# Patient Record
Sex: Male | Born: 2000 | Hispanic: Refuse to answer | Marital: Single | Smoking: Never smoker
Health system: Southern US, Community
[De-identification: ages and names within clinical notes are randomized; demographics above are authoritative.]

## PROBLEM LIST (undated history)

## (undated) DIAGNOSIS — J309 Allergic rhinitis, unspecified: Secondary | ICD-10-CM

## (undated) DIAGNOSIS — K219 Gastro-esophageal reflux disease without esophagitis: Secondary | ICD-10-CM

## (undated) HISTORY — PX: TONSILLECTOMY: SUR1361

## (undated) HISTORY — DX: Gastro-esophageal reflux disease without esophagitis: K21.9

## (undated) HISTORY — DX: Allergic rhinitis, unspecified: J30.9

---

## 2018-01-28 ENCOUNTER — Ambulatory Visit (INDEPENDENT_AMBULATORY_CARE_PROVIDER_SITE_OTHER): Payer: PRIVATE HEALTH INSURANCE | Admitting: Family Medicine

## 2018-01-28 ENCOUNTER — Encounter: Payer: Self-pay | Admitting: Family Medicine

## 2018-01-28 VITALS — BP 110/58 | HR 55 | Temp 98.1°F | Resp 14

## 2018-01-28 DIAGNOSIS — R509 Fever, unspecified: Secondary | ICD-10-CM

## 2018-01-28 DIAGNOSIS — J029 Acute pharyngitis, unspecified: Secondary | ICD-10-CM

## 2018-01-28 LAB — CBC WITH DIFFERENTIAL/PLATELET
BASOS ABS: 0 10*3/uL (ref 0.0–0.2)
Basos: 1 %
EOS (ABSOLUTE): 0.2 10*3/uL (ref 0.0–0.4)
Eos: 3 %
HEMATOCRIT: 44.2 % (ref 37.5–51.0)
Hemoglobin: 16 g/dL (ref 13.0–17.7)
Immature Grans (Abs): 0 10*3/uL (ref 0.0–0.1)
Immature Granulocytes: 0 %
Lymphocytes Absolute: 1.6 10*3/uL (ref 0.7–3.1)
Lymphs: 26 %
MCH: 32 pg (ref 26.6–33.0)
MCHC: 36.2 g/dL — ABNORMAL HIGH (ref 31.5–35.7)
MCV: 88 fL (ref 79–97)
MONOS ABS: 0.6 10*3/uL (ref 0.1–0.9)
Monocytes: 10 %
NEUTROS ABS: 3.7 10*3/uL (ref 1.4–7.0)
Neutrophils: 60 %
Platelets: 190 10*3/uL (ref 150–450)
RBC: 5 x10E6/uL (ref 4.14–5.80)
RDW: 13.5 % (ref 12.3–15.4)
WBC: 6.2 10*3/uL (ref 3.4–10.8)

## 2018-01-28 LAB — POCT INFLUENZA A/B
INFLUENZA B, POC: NEGATIVE
Influenza A, POC: NEGATIVE

## 2018-01-28 LAB — POCT RAPID STREP A (OFFICE): Rapid Strep A Screen: NEGATIVE

## 2018-01-28 NOTE — Progress Notes (Signed)
Patient presents today with sore throat and feeling hot and cold for the last day.patient denies measured fever. Denies any nasal congestion or cough. Does admit to mild headache. Sore throat is a 6 out of 10 right now. Denies any sick contacts. Denies any abdominal pain, nausea, vomiting, diarrhea, extreme fatigue. Patient is not taking any medications today. He admits to taking Ibuprofen before bed last night.  ROS: Negative except mentioned above. Vitals as per Epic. GENERAL: NAD HEENT: moderate pharyngeal erythema, no exudate, no erythema of TMs, no cervical LAD RESP: CTA B CARD: RRR ABD: positive bowel sounds, nontender, no rebound or guarding appreciated, no organomegaly appreciated NEURO: CN II-XII grossly intact   A/P: Pharyngitis - rapid strep test negative, flu screen negative, CBC with differential sent to lab, rest, hydration, ibuprofen when necessary, no athletic activity for now until results have been reviewed, seek medical attention if any persistent or worsening symptoms.

## 2018-02-08 ENCOUNTER — Ambulatory Visit (INDEPENDENT_AMBULATORY_CARE_PROVIDER_SITE_OTHER): Payer: PRIVATE HEALTH INSURANCE | Admitting: Family Medicine

## 2018-02-08 VITALS — BP 129/57 | HR 66 | Temp 98.0°F | Resp 14

## 2018-02-08 DIAGNOSIS — J069 Acute upper respiratory infection, unspecified: Secondary | ICD-10-CM

## 2018-02-08 MED ORDER — AZITHROMYCIN 250 MG PO TABS: ORAL_TABLET | ORAL | 0 refills | Status: DC

## 2018-02-08 MED ORDER — BENZONATATE 100 MG PO CAPS: 100.0000 mg | ORAL_CAPSULE | Freq: Two times a day (BID) | ORAL | 0 refills | Status: DC | PRN

## 2018-02-08 NOTE — Progress Notes (Signed)
Patient presents with symptoms of nasal congestion, sinus pressure, mild productive cough. Has had symptoms for the last 4-5 days. Denies any sore throat, CP, SOB, fever, night sweats, severe headache, N/V/D, abdominal pain. Denies hx of asthma but does have chronic allergies and takes Singulair. The mucus has been yellow-green. Has tried Robitussin for cough with minimal relief. Admits that he has been able to practice with no significant difficulty.  ROS: Negative except mentioned above. Vitals as per Epic.  GENERAL: NAD HEENT: mild pharyngeal erythema, no exudate, no erythema of TMs, no cervical LAD RESP: CTA B CARD: RRR NEURO: CN II-XII grossly intact    A/P: URI - will treat with Z-pk given allergy to PCN, continue Singulair, add Flonase for nasal congestion, can try Tessalon Perles if OTC cough suppressant is not working, rest, hydration, no athletic activity if febrile or experiencing SOB as discussed.

## 2018-03-26 ENCOUNTER — Encounter: Payer: Self-pay | Admitting: Family Medicine

## 2018-03-26 ENCOUNTER — Ambulatory Visit (INDEPENDENT_AMBULATORY_CARE_PROVIDER_SITE_OTHER): Payer: PRIVATE HEALTH INSURANCE | Admitting: Family Medicine

## 2018-03-26 VITALS — BP 130/58 | HR 51 | Temp 97.8°F | Resp 14

## 2018-03-26 DIAGNOSIS — J029 Acute pharyngitis, unspecified: Secondary | ICD-10-CM | POA: Diagnosis not present

## 2018-03-26 DIAGNOSIS — J069 Acute upper respiratory infection, unspecified: Secondary | ICD-10-CM | POA: Diagnosis not present

## 2018-03-26 MED ORDER — AZITHROMYCIN 250 MG PO TABS: ORAL_TABLET | ORAL | 0 refills | Status: DC

## 2018-03-26 NOTE — Progress Notes (Signed)
Patient presents today with symptoms of sore throat, cough, nasal congestion.  Patient states that he has had the symptoms for the past week.  He denies any fever, chest pain, shortness of breath.  He denies any history of asthma.  He states the mucus is now green in color.  He has been taking his Allegra and leftover [employer].  He denies any problems with playing football.  ROS: Negative except mentioned above. Vitals as per Epic. GENERAL: NAD HEENT: mild pharyngeal erythema, no exudate, no erythema of TMs, no cervical LAD RESP: CTA B CARD: RRR NEURO: CN II-XII grossly intact   A/P: URI - will treat with Z-Pak, can continue oral antihistamine and cough suppressant, rest, hydration, seek medical attention if symptoms persist or worsen.  No athletic activity or class if febrile.  Athletic activity as tolerated.

## 2018-06-06 ENCOUNTER — Ambulatory Visit (INDEPENDENT_AMBULATORY_CARE_PROVIDER_SITE_OTHER): Payer: PRIVATE HEALTH INSURANCE | Admitting: Family Medicine

## 2018-06-06 VITALS — BP 113/67 | HR 77 | Temp 98.4°F | Resp 14

## 2018-06-06 DIAGNOSIS — R509 Fever, unspecified: Secondary | ICD-10-CM

## 2018-06-06 LAB — POCT INFLUENZA A/B
Influenza A, POC: NEGATIVE
Influenza B, POC: NEGATIVE

## 2018-06-06 LAB — POCT RAPID STREP A (OFFICE): RAPID STREP A SCREEN: NEGATIVE

## 2018-06-06 MED ORDER — OSELTAMIVIR PHOSPHATE 75 MG PO CAPS: 75.0000 mg | ORAL_CAPSULE | Freq: Two times a day (BID) | ORAL | 0 refills | Status: DC

## 2018-06-06 NOTE — Progress Notes (Signed)
Patient presents today with symptoms of cough and fever.  Patient has had symptoms for the last 2 days.  Patient states that he has been around sick contacts with flulike symptoms.  He has been sweating and having chills.  He denies any abdominal pain but did cough and vomit once.  He has drank fluids this morning without problems.  He denies sore throat, chest pain, shortness of breath, abdominal pain, urinary symptoms, severe headache, neck stiffness, photophobia.  Patient denies any history of asthma or any smoking history.  He admits to getting the influenza vaccination during the fall.  ROS: Negative except mentioned above. Vitals as per Epic. GENERAL: NAD HEENT: no pharyngeal erythema, no exudate, no erythema of TMs, no cervical LAD RESP: CTA B CARD: RRR ABD: Bowel sounds, nontender, no rebound or guarding appreciated NEURO: CN II-XII grossly intact   A/P: Viral Illness -restive of influenza virus, will treat patient with Tamiflu, rest, hydration, Tylenol/ibuprofen as needed, cough suppressant as needed, no athletic activity or class until afebrile for 24 hours without fever lowering medication, seek medical attention if symptoms persist or worsen as discussed.

## 2018-07-01 ENCOUNTER — Ambulatory Visit (INDEPENDENT_AMBULATORY_CARE_PROVIDER_SITE_OTHER): Payer: PRIVATE HEALTH INSURANCE | Admitting: Family Medicine

## 2018-07-01 DIAGNOSIS — J209 Acute bronchitis, unspecified: Secondary | ICD-10-CM

## 2018-07-01 LAB — POCT INFLUENZA A/B
Influenza A, POC: NEGATIVE
Influenza B, POC: NEGATIVE

## 2018-07-01 LAB — POCT RAPID STREP A (OFFICE): Rapid Strep A Screen: NEGATIVE

## 2018-07-01 MED ORDER — ALBUTEROL SULFATE HFA 108 (90 BASE) MCG/ACT IN AERS: 2.0000 | INHALATION_SPRAY | RESPIRATORY_TRACT | 2 refills | Status: DC | PRN

## 2018-07-01 MED ORDER — AZITHROMYCIN 250 MG PO TABS: ORAL_TABLET | ORAL | 0 refills | Status: DC

## 2018-07-01 MED ORDER — BENZONATATE 100 MG PO CAPS: 100.0000 mg | ORAL_CAPSULE | Freq: Two times a day (BID) | ORAL | 0 refills | Status: DC | PRN

## 2018-07-01 NOTE — Addendum Note (Signed)
Addended by: Dione Housekeeper on: 07/01/2018 04:49 PM   Modules accepted: Orders

## 2018-07-01 NOTE — Progress Notes (Signed)
Patient presents today with symptoms of mild productive cough.  Patient states that since he was treated for the flu last month he has had some cough.  Mucus is now slightly yellow in color.  He denies having fever.  He was coughing so much once after practice that he vomited.  He has been taking Robitussin at night but nothing during the daytime.  He denies any chest pain or shortness of breath.  He does believe that at times he is wheezing.  He does not have a history of asthma.  He denies any nausea, diarrhea, abdominal pain, stream fatigue, sore throat, postnasal drip.  ROS: Negative except mentioned above. Vitals as per Epic. GENERAL: NAD HEENT: no pharyngeal erythema, no exudate, no erythema of TMs, no cervical LAD RESP: CTA B CARD: RRR NEURO: CN II-XII grossly intact   A/P: Bronchitis -will prescribe Z-Pak, Tessalon Perles as needed, Albuterol Inhaler as needed, would recommend that he activity until his cough is better controlled, can use albuterol inhaler 30 minutes before exercise if needed, if symptoms do persist/worsen by the end of the week would like to see patient again to do labs and possibly chest x-ray.  Patient addresses understanding.  Will discuss above with athletic trainer.  Seek medical attention if any acute problems.

## 2018-11-16 ENCOUNTER — Other Ambulatory Visit: Payer: Self-pay

## 2018-11-16 DIAGNOSIS — R1011 Right upper quadrant pain: Secondary | ICD-10-CM | POA: Diagnosis not present

## 2018-11-16 DIAGNOSIS — Z79899 Other long term (current) drug therapy: Secondary | ICD-10-CM | POA: Diagnosis not present

## 2018-11-16 DIAGNOSIS — Z20828 Contact with and (suspected) exposure to other viral communicable diseases: Secondary | ICD-10-CM | POA: Insufficient documentation

## 2018-11-16 NOTE — ED Triage Notes (Signed)
Pt reports right mid back pain x 3 days that now radiates around to the right lower quadrant; pt says lower right abd was very tender when dad pressed on the area; denies fever; denies urinary s/s; denies N/V/D; reports "irregular" poops; pt says stools have been looser than normal

## 2018-11-17 ENCOUNTER — Encounter: Payer: Self-pay | Admitting: Emergency Medicine

## 2018-11-17 ENCOUNTER — Emergency Department: Payer: PRIVATE HEALTH INSURANCE

## 2018-11-17 ENCOUNTER — Emergency Department
Admission: EM | Admit: 2018-11-17 | Discharge: 2018-11-17 | Disposition: A | Discharge status: Home / Self Care | Payer: PRIVATE HEALTH INSURANCE | Attending: Emergency Medicine | Admitting: Emergency Medicine

## 2018-11-17 DIAGNOSIS — R1011 Right upper quadrant pain: Secondary | ICD-10-CM

## 2018-11-17 LAB — COMPREHENSIVE METABOLIC PANEL
ALT: 29 U/L (ref 0–44)
AST: 25 U/L (ref 15–41)
Albumin: 4.9 g/dL (ref 3.5–5.0)
Alkaline Phosphatase: 56 U/L (ref 38–126)
Anion gap: 8 (ref 5–15)
BUN: 18 mg/dL (ref 6–20)
CO2: 22 mmol/L (ref 22–32)
Calcium: 9.2 mg/dL (ref 8.9–10.3)
Chloride: 105 mmol/L (ref 98–111)
Creatinine, Ser: 1.28 mg/dL — ABNORMAL HIGH (ref 0.61–1.24)
GFR calc Af Amer: >60 mL/min (ref >60)
GFR calc non Af Amer: >60 mL/min (ref >60)
Glucose, Bld: 103 mg/dL — ABNORMAL HIGH (ref 70–99)
Potassium: 3.3 mmol/L — ABNORMAL LOW (ref 3.5–5.1)
Sodium: 135 mmol/L (ref 135–145)
Total Bilirubin: 1.2 mg/dL (ref 0.3–1.2)
Total Protein: 8 g/dL (ref 6.5–8.1)

## 2018-11-17 LAB — LIPASE, BLOOD: Lipase: 27 U/L (ref 11–51)

## 2018-11-17 LAB — CBC
HCT: 44.2 % (ref 39.0–52.0)
Hemoglobin: 16 g/dL (ref 13.0–17.0)
MCH: 30.4 pg (ref 26.0–34.0)
MCHC: 36.2 g/dL — ABNORMAL HIGH (ref 30.0–36.0)
MCV: 83.9 fL (ref 80.0–100.0)
Platelets: 227 10*3/uL (ref 150–400)
RBC: 5.27 MIL/uL (ref 4.22–5.81)
RDW: 11.9 % (ref 11.5–15.5)
WBC: 9.8 10*3/uL (ref 4.0–10.5)
nRBC: 0 % (ref 0.0–0.2)

## 2018-11-17 LAB — URINALYSIS, COMPLETE (UACMP) WITH MICROSCOPIC
Bacteria, UA: NONE SEEN
Bilirubin Urine: NEGATIVE
Glucose, UA: NEGATIVE mg/dL
Hgb urine dipstick: NEGATIVE
Ketones, ur: 5 mg/dL — AB
Leukocytes,Ua: NEGATIVE
Nitrite: NEGATIVE
Protein, ur: NEGATIVE mg/dL
Specific Gravity, Urine: 1.029 (ref 1.005–1.030)
Squamous Epithelial / HPF: NONE SEEN (ref 0–5)
pH: 5 (ref 5.0–8.0)

## 2018-11-17 NOTE — ED Notes (Signed)
Patient reports finished tetracycline yesterday for skin infection.

## 2018-11-17 NOTE — Discharge Instructions (Signed)
Your lab tests, CT scan, and ultrasound were all okay today.  We sent a coronavirus test today which should be resulted in 1 to 2 days.  We will give you a call if it is positive.  In the meantime, continue isolating herself from others until that result is available.  Take anti-inflammatory medicine such as naproxen 500 mg 2 times a day for pain control.  Drink lots of fluids to stay well-hydrated.

## 2018-11-17 NOTE — ED Notes (Signed)
No peripheral IV placed this visit.   Discharge instructions reviewed with patient. Questions fielded by this RN. Patient verbalizes understanding of instructions. Patient discharged home in stable condition per stafford. No acute distress noted at time of discharge.   

## 2018-11-17 NOTE — ED Provider Notes (Signed)
[facility] Emergency Department Provider Note  ____________________________________________  Time seen: Approximately 5:15 AM  I have reviewed the triage vital signs and the nursing notes.   HISTORY  Chief Complaint Abdominal Pain    HPI George Clay is a 18 y.o. male with no significant past medical history who complains of gradual onset of right flank pain radiating to the right lower quadrant abdomen.  Intermittent, worse with coughing or laughing, no alleviating factors.  Waxing and waning and severe at times.  Denies nausea vomiting diarrhea or constipation.  No fevers chills sweats fatigue or body aches.  No known sick contacts.  Reports that he has essentially been isolating at home for the past 3 months.  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      Past medical history noncontributory   There are no active problems to display for this patient.    Past Surgical History:  Procedure Laterality Date  . TONSILLECTOMY       Prior to Admission medications   Medication Sig Start Date End Date Taking? Authorizing Provider  tetracycline (SUMYCIN) 250 MG capsule Take 250 mg by mouth 4 (four) times daily.   Yes [provider]     Allergies Penicillins and Amoxicillin   Family History  Problem Relation Age of Onset  . Hypertension Father   . Cancer Paternal Grandmother     Social History Social History   Tobacco Use  . Smoking status: Never Smoker  . Smokeless tobacco: Never Used  Substance Use Topics  . Alcohol use: Yes  . Drug use: Yes    Types: Marijuana    Comment: last smoked 12 hours ago    Review of Systems  Constitutional:   No fever or chills.  ENT:   No sore throat. No rhinorrhea. Cardiovascular:   No chest pain or syncope. Respiratory:   No dyspnea or cough. Gastrointestinal:   Positive as above for right-sided abdominal pain without vomiting  and diarrhea.  Musculoskeletal:   Negative for focal pain or swelling All other systems reviewed and are negative except as documented above in ROS and HPI.  ____________________________________________   PHYSICAL EXAM:  VITAL SIGNS: ED Triage Vitals  Enc Vitals Group     BP 11/16/18 2342 130/70     Pulse Rate 11/16/18 2342 89     Resp 11/16/18 2342 18     Temp 11/16/18 2342 99.2 F (37.3 C)     Temp Source 11/16/18 2342 Oral     SpO2 11/16/18 2342 96 %     Weight 11/16/18 2359 (!) 310 lb 13.6 oz (141 kg)     Height 11/16/18 2359 6\' 2"  (1.88 m)     Head Circumference --      Peak Flow --      Pain Score 11/16/18 2343 8     Pain Loc --      Pain Edu? --      Excl. in GC? --    Rechecked his temperature, 98.5 F  Vital signs reviewed, nursing assessments reviewed.   Constitutional:   Alert and oriented. Non-toxic appearance. Eyes:   Conjunctivae are normal. EOMI. PERRL. ENT      Head:   Normocephalic and atraumatic.      Nose:   No congestion/rhinnorhea.       Mouth/Throat:   MMM, no pharyngeal erythema. No peritonsillar mass.       Neck:  No meningismus. Full ROM. Hematological/Lymphatic/Immunilogical:   No cervical lymphadenopathy. Cardiovascular:   RRR. Symmetric bilateral radial and DP pulses.  No murmurs. Cap refill less than 2 seconds. Respiratory:   Normal respiratory effort without tachypnea/retractions. Breath sounds are clear and equal bilaterally. No wheezes/rales/rhonchi. Gastrointestinal:   Soft with right upper quadrant tenderness. Non distended. There is no CVA tenderness.  No rebound, rigidity, or guarding.  Musculoskeletal:   Normal range of motion in all extremities. No joint effusions.  No lower extremity tenderness.  No edema.  There is tenderness over the right inferolateral false ribs reproducing his pain Neurologic:   Normal speech and language.  Motor grossly intact. No acute focal neurologic deficits are appreciated.  Skin:    Skin is warm,  dry and intact. No rash noted.  No petechiae, purpura, or bullae.  ____________________________________________    LABS (pertinent positives/negatives) (all labs ordered are listed, but only abnormal results are displayed) Labs Reviewed  COMPREHENSIVE METABOLIC PANEL - Abnormal; Notable for the following components:      Result Value   Potassium 3.3 (*)    Glucose, Bld 103 (*)    Creatinine, Ser 1.28 (*)    All other components within normal limits  CBC - Abnormal; Notable for the following components:   MCHC 36.2 (*)    All other components within normal limits  URINALYSIS, COMPLETE (UACMP) WITH MICROSCOPIC - Abnormal; Notable for the following components:   Color, Urine YELLOW (*)    APPearance CLEAR (*)    Ketones, ur 5 (*)    All other components within normal limits  NOVEL CORONAVIRUS, NAA (HOSPITAL ORDER, SEND-OUT TO REF LAB)  LIPASE, BLOOD   ____________________________________________   EKG    ____________________________________________    RADIOLOGY  Ct Renal Stone Study  Result Date: 11/17/2018 CLINICAL DATA:  Acute abdominal pain with back pain radiating to right lower abdominal quadrant. EXAM: CT ABDOMEN AND PELVIS WITHOUT CONTRAST TECHNIQUE: Multidetector CT imaging of the abdomen and pelvis was performed following the standard protocol without IV contrast. COMPARISON:  None. FINDINGS: Lower chest: No acute abnormality. Hepatobiliary: No focal liver abnormality is seen. No gallstones, gallbladder wall thickening, or biliary dilatation. Pancreas: Unremarkable. No pancreatic ductal dilatation or surrounding inflammatory changes. Spleen: The spleen is enlarged measuring approximately 14 cm. Adrenals/Urinary Tract: Adrenal glands are unremarkable. Kidneys are normal, without renal calculi, focal lesion, or hydronephrosis. Bladder is unremarkable. Stomach/Bowel: Stomach is within normal limits. Appendix appears normal. No evidence of bowel wall thickening, distention,  or inflammatory changes. Vascular/Lymphatic: No significant vascular findings are present. No enlarged abdominal or pelvic lymph nodes. Reproductive: Prostate is unremarkable. Other: No abdominal wall hernia or abnormality. No abdominopelvic ascites. Musculoskeletal: No fracture is seen. IMPRESSION: 1. No acute abnormality. No hydronephrosis. No radiopaque obstructing kidney stones. Normal appendix in the right lower quadrant. 2. Nonspecific splenomegaly. Electronically Signed   By: Katherine Mantlehristopher  Green M.D.   On: 11/17/2018 01:43   [place] Abdomen Limited Ruq  Result Date: 11/17/2018 CLINICAL DATA:  RIGHT upper quadrant pain EXAM: ULTRASOUND ABDOMEN LIMITED RIGHT UPPER QUADRANT COMPARISON:  CT abdomen from earlier same day. FINDINGS: Gallbladder: No gallstones or wall thickening visualized. No sonographic Murphy sign noted by sonographer. Common bile duct: Diameter: 3 mm Liver: No focal lesion identified. Within normal limits in parenchymal echogenicity. Portal vein is patent on color Doppler imaging with normal direction of blood flow towards the liver. IMPRESSION: Normal RIGHT upper quadrant ultrasound. Electronically Signed   By: Bary RichardStan  Maynard M.D.   On: 11/17/2018  05:37    ____________________________________________   PROCEDURES Procedures  ____________________________________________  DIFFERENTIAL DIAGNOSIS   Cholecystitis, appendicitis, ureterolithiasis, colitis, constipation, inferior rib contusion, early viral syndrome  CLINICAL IMPRESSION / ASSESSMENT AND PLAN / ED COURSE  Medications ordered in the ED: Medications - No data to display  Pertinent labs & imaging results that were available during my care of the patient were reviewed by me and considered in my medical decision making (see chart for details).  Jerri Hargadon was evaluated in Emergency Department on 11/17/2018 for the symptoms described in the history of present illness. He was evaluated in the context of the global COVID-19  pandemic, which necessitated consideration that the patient might be at risk for infection with the SARS-CoV-2 virus that causes COVID-19. Institutional protocols and algorithms that pertain to the evaluation of patients at risk for COVID-19 are in a state of rapid change based on information released by regulatory bodies including the CDC and federal and state organizations. These policies and algorithms were followed during the patient's care in the ED.   Patient presents with right-sided abdominal pain.  Labs are normal, urinalysis negative without evidence of hematuria.  CT abdomen pelvis is normal except for splenomegaly.  Patient is not having any pronounced viral syndrome symptoms and has no known sick contacts and has been in isolation for the past 3 months due to the pandemic.  I will obtain an ultrasound of the right upper quadrant to evaluate for biliary disease.  Given his normal vitals and labs, if the ultrasound is negative this would seem to either be musculoskeletal pain or possibly an early viral illness necessitating coronavirus testing.  This can be done with a [facility] send out for outpatient follow-up.  Clinical Course as of Nov 16 617  Sun Nov 17, 2018  0552 Ultrasound normal.  Will send coronavirus test, discharge home, continue self quarantine pending result.  NSAIDs for pain control.   [PS]    Clinical Course User Index [PS] Carrie Mew, MD     ____________________________________________   FINAL CLINICAL IMPRESSION(S) / ED DIAGNOSES    Final diagnoses:  Right upper quadrant abdominal pain     ED Discharge Orders    None      Portions of this note were generated with dragon dictation software. Dictation errors may occur despite best attempts at proofreading.   Carrie Mew, MD 11/17/18 [phone]

## 2018-11-19 LAB — NOVEL CORONAVIRUS, NAA (HOSP ORDER, SEND-OUT TO REF LAB; TAT 18-24 HRS): SARS-CoV-2, NAA: NOT DETECTED

## 2018-11-20 ENCOUNTER — Telehealth: Payer: Self-pay | Admitting: Emergency Medicine

## 2018-11-20 NOTE — Telephone Encounter (Signed)
Called patient and gave him negative covid 19 test result.

## 2018-11-21 ENCOUNTER — Other Ambulatory Visit: Payer: Self-pay | Admitting: *Deleted

## 2018-11-21 DIAGNOSIS — Z20822 Contact with and (suspected) exposure to covid-19: Secondary | ICD-10-CM

## 2018-11-25 ENCOUNTER — Other Ambulatory Visit: Payer: Self-pay

## 2018-11-25 DIAGNOSIS — Z20822 Contact with and (suspected) exposure to covid-19: Secondary | ICD-10-CM

## 2018-11-30 LAB — NOVEL CORONAVIRUS, NAA: SARS-CoV-2, NAA: NOT DETECTED

## 2018-12-06 ENCOUNTER — Other Ambulatory Visit: Payer: Self-pay | Admitting: Family Medicine

## 2018-12-06 DIAGNOSIS — Z20828 Contact with and (suspected) exposure to other viral communicable diseases: Secondary | ICD-10-CM

## 2018-12-06 DIAGNOSIS — Z20822 Contact with and (suspected) exposure to covid-19: Secondary | ICD-10-CM

## 2018-12-11 LAB — NOVEL CORONAVIRUS, NAA: SARS-CoV-2, NAA: NOT DETECTED

## 2019-01-02 ENCOUNTER — Other Ambulatory Visit: Payer: Self-pay

## 2019-01-02 DIAGNOSIS — Z20822 Contact with and (suspected) exposure to covid-19: Secondary | ICD-10-CM

## 2019-01-04 LAB — NOVEL CORONAVIRUS, NAA: SARS-CoV-2, NAA: NOT DETECTED

## 2019-01-04 LAB — SPECIMEN STATUS REPORT

## 2019-02-03 ENCOUNTER — Other Ambulatory Visit: Payer: Self-pay | Admitting: *Deleted

## 2019-02-03 DIAGNOSIS — Z20822 Contact with and (suspected) exposure to covid-19: Secondary | ICD-10-CM

## 2019-02-04 LAB — NOVEL CORONAVIRUS, NAA: SARS-CoV-2, NAA: NOT DETECTED

## 2020-10-26 IMAGING — US ULTRASOUND ABDOMEN LIMITED
1 series · 14 of 25 positions shown · non-contrast
Comparison: CT abdomen from earlier same day.

CLINICAL DATA: RIGHT upper quadrant pain

EXAM:
ULTRASOUND ABDOMEN LIMITED RIGHT UPPER QUADRANT

[Series 1: ultrasound abdomen limited · 14 of 43 slices shown]
[im 1/43]
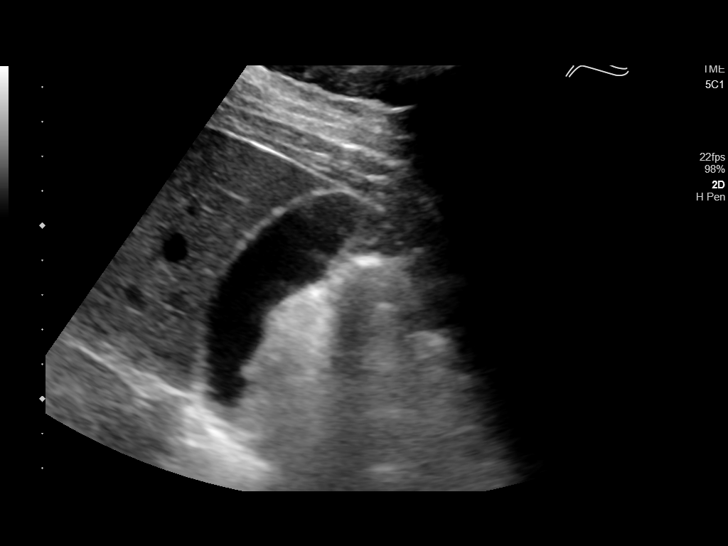
[im 4/43]
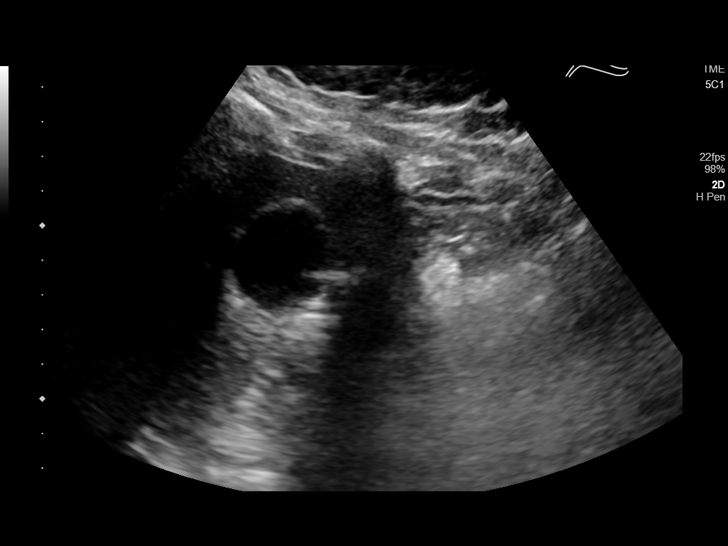
[im 8/43]
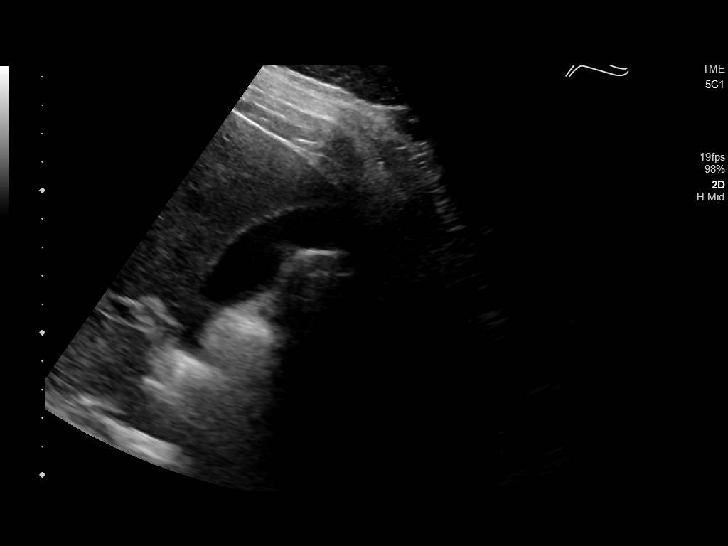
[im 11/43]
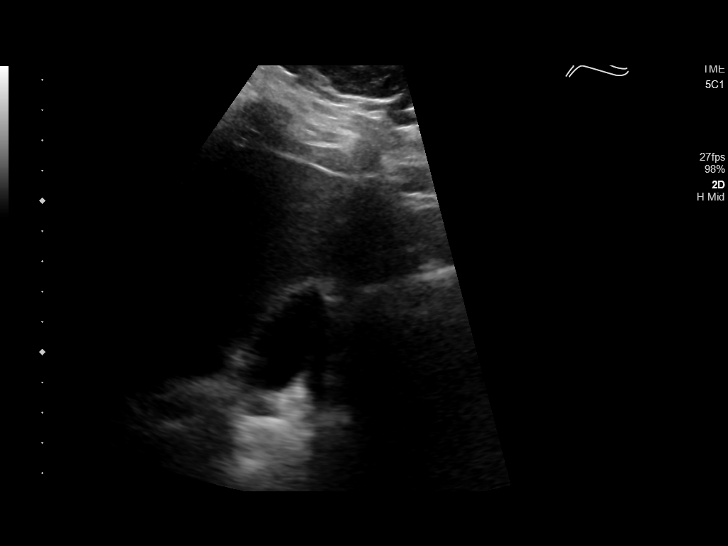
[im 15/43]
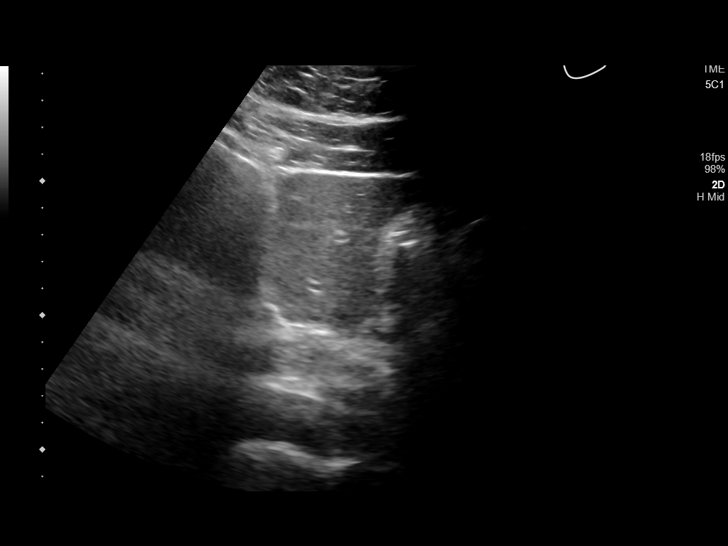
[im 16/43]
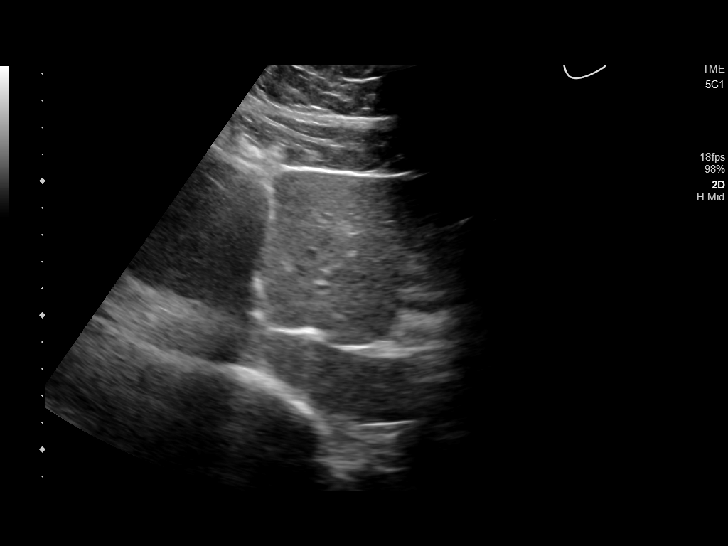
[im 20/43]
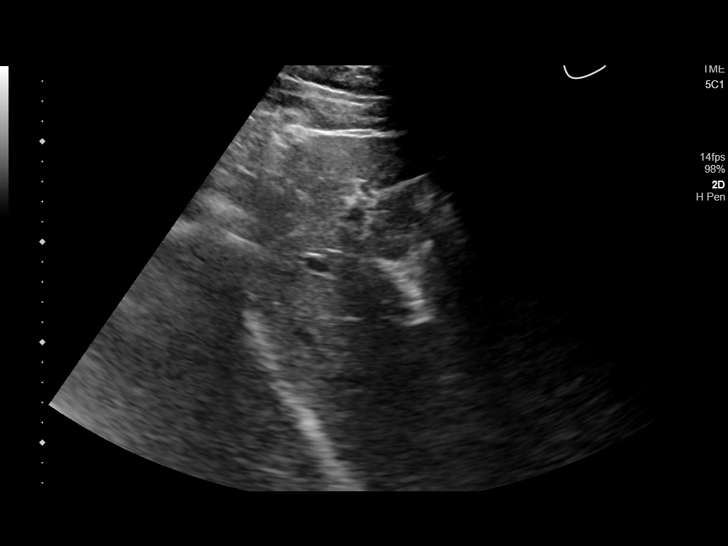
[im 23/43]
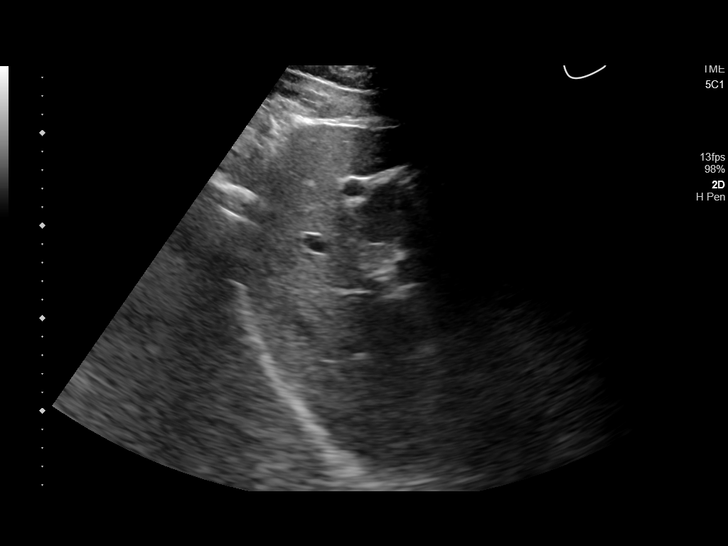
[im 27/43]
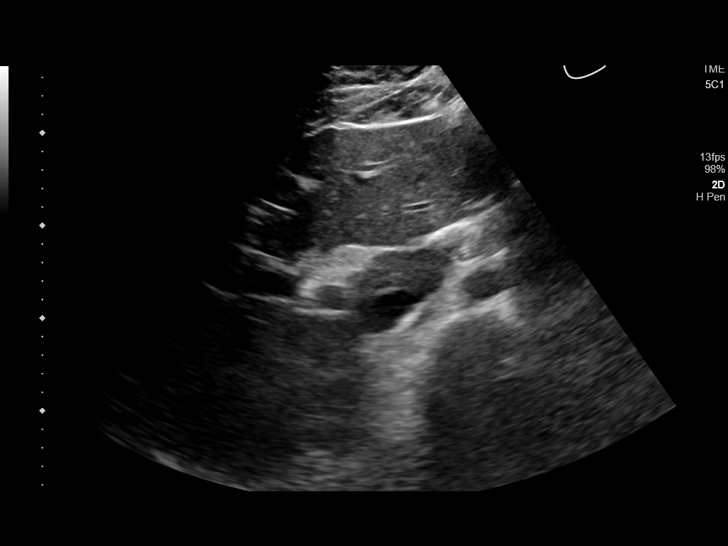
[im 29/43]
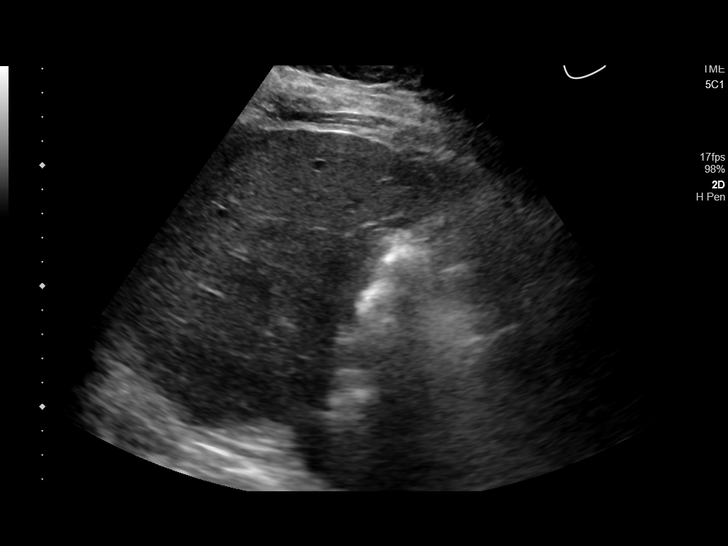
[im 32/43]
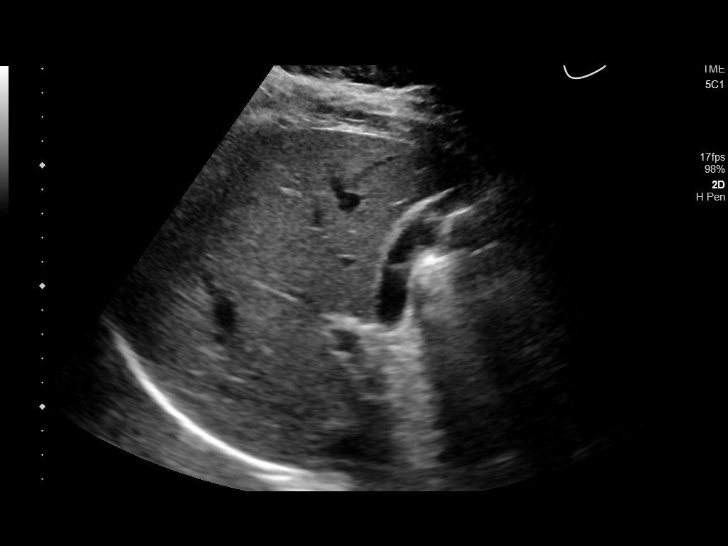
[im 36/43]
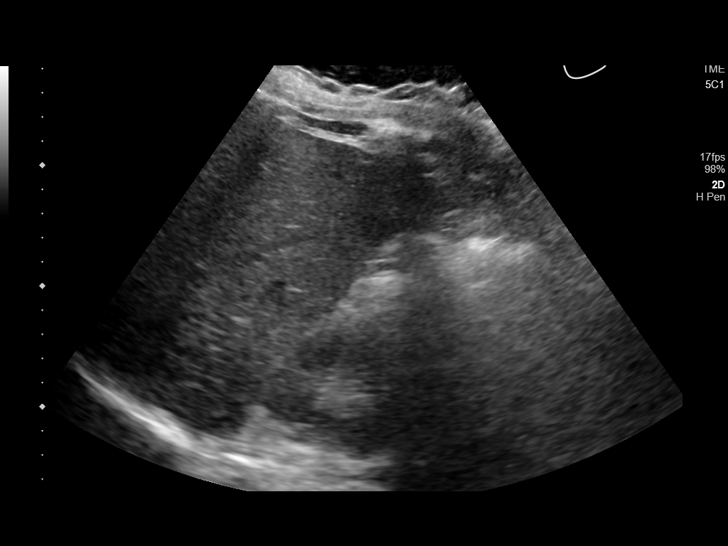
[im 39/43]
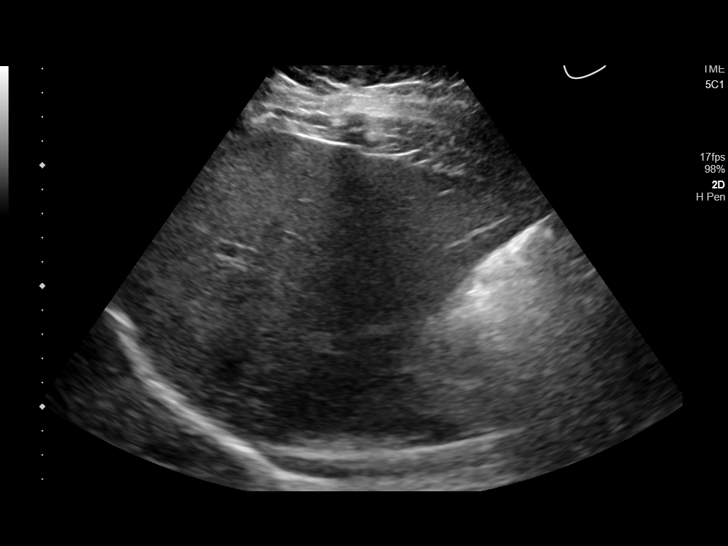
[im 43/43]
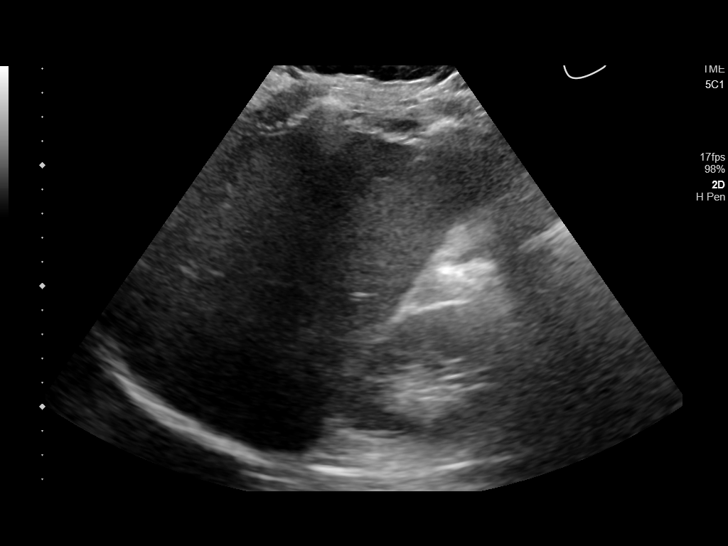

[14 of 25 positions shown; findings below may reference images not displayed]

FINDINGS: Gallbladder:

No gallstones or wall thickening visualized. No sonographic Murphy
sign noted by sonographer.

Common bile duct:

Diameter: 3 mm

Liver:

No focal lesion identified. Within normal limits in parenchymal
echogenicity. Portal vein is patent on color Doppler imaging with
normal direction of blood flow towards the liver.
IMPRESSION: Normal RIGHT upper quadrant ultrasound.

## 2020-10-26 IMAGING — CT CT RENAL STONE PROTOCOL
2 of 4 series · 16 of 46 positions shown, 18 images · non-contrast
Comparison: None.

CLINICAL DATA: Acute abdominal pain with back pain radiating to
right lower abdominal quadrant.

EXAM:
CT ABDOMEN AND PELVIS WITHOUT CONTRAST
TECHNIQUE: Multidetector CT imaging of the abdomen and pelvis was performed
following the standard protocol without IV contrast.

[Series 2: stone full standard · axial · 0.87mm/px · z∈[-1155,-700]mm · 13 of 101 slices shown, 15 images]
[im 5/101  soft-tissue]
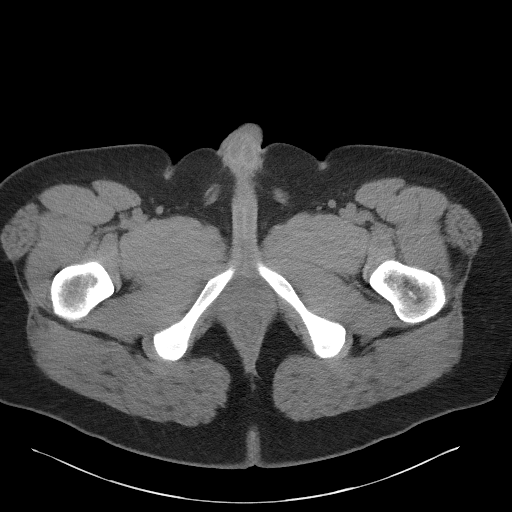
[im 5/101  bone]
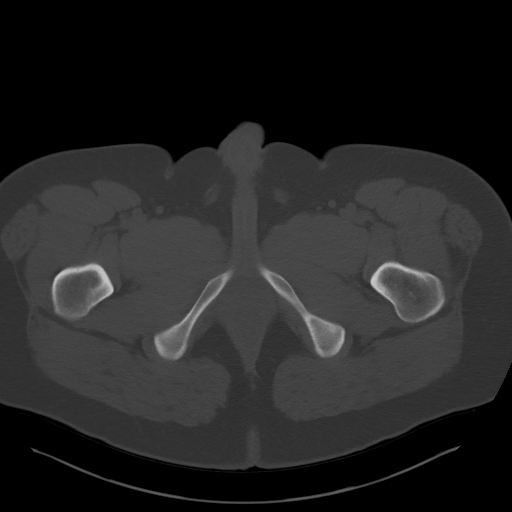
[im 13/101  soft-tissue]
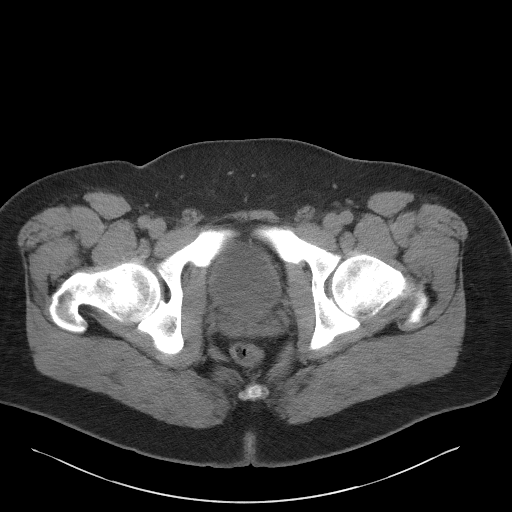
[im 21/101  soft-tissue]
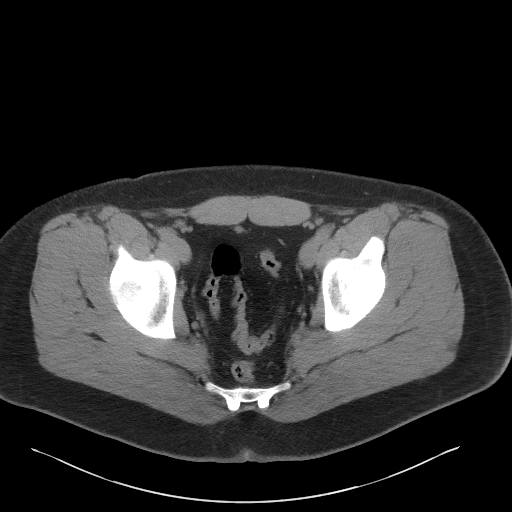
[im 30/101  soft-tissue]
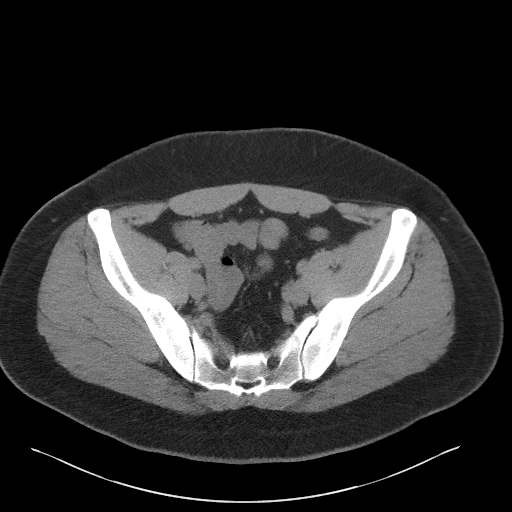
[im 34/101  soft-tissue]
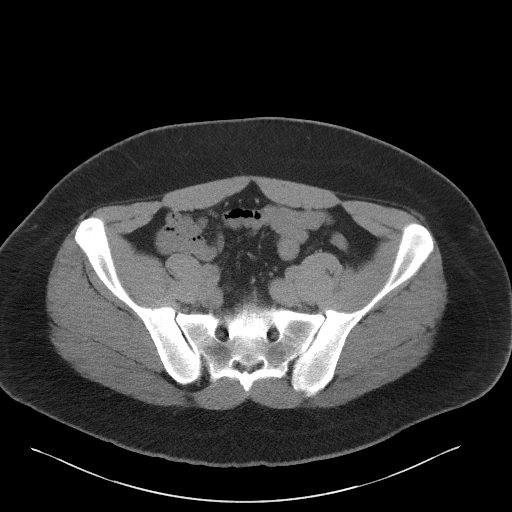
[im 42/101  soft-tissue]
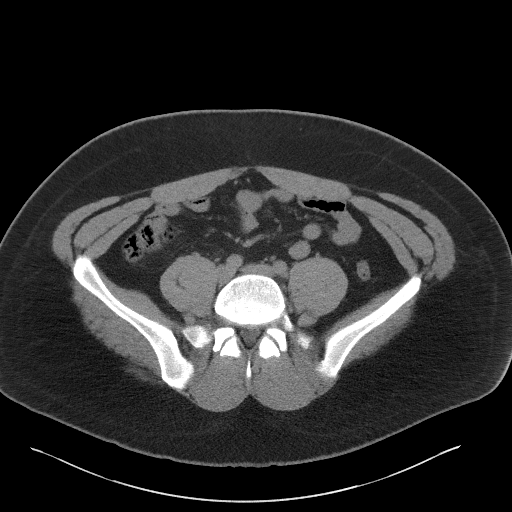
[im 51/101  soft-tissue]
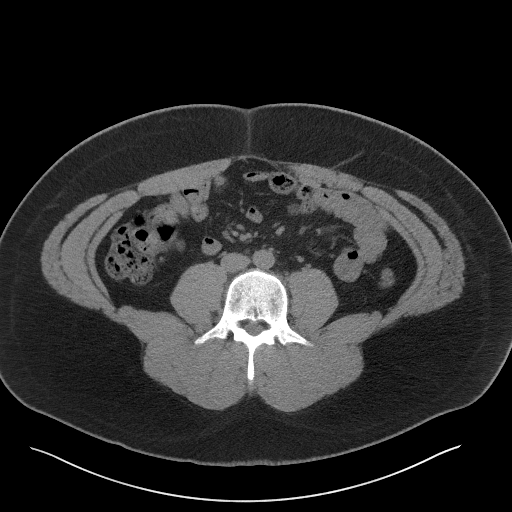
[im 59/101  soft-tissue]
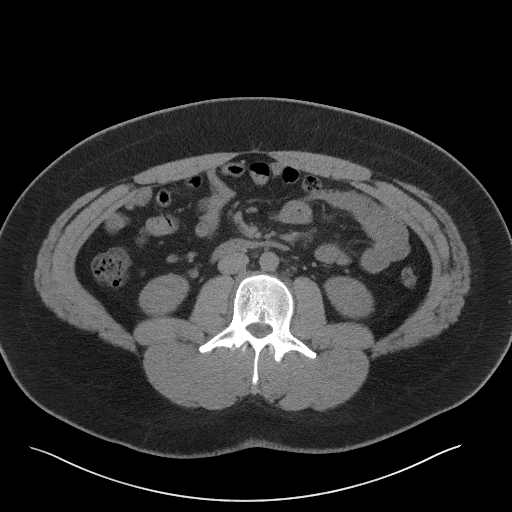
[im 67/101  soft-tissue]
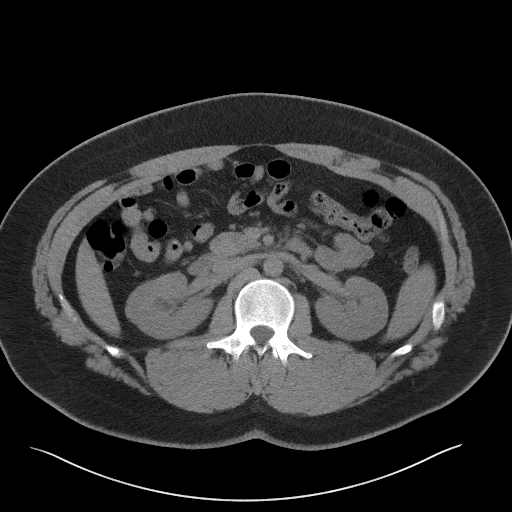
[im 67/101  bone]
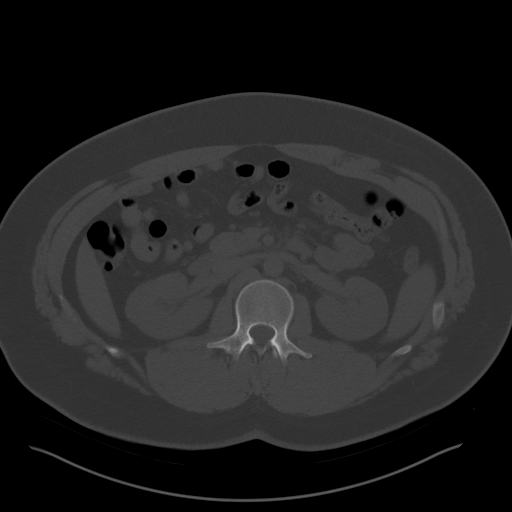
[im 71/101  soft-tissue]
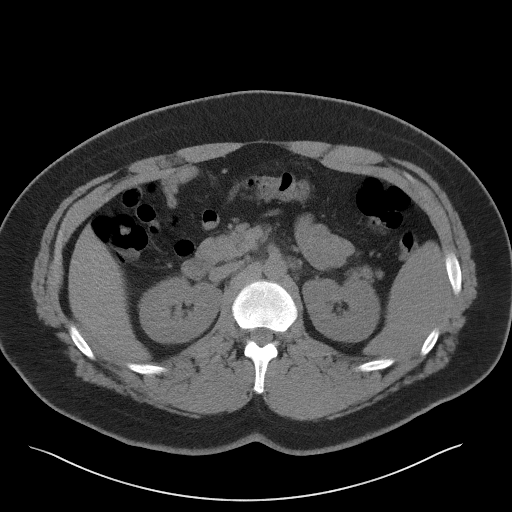
[im 80/101  soft-tissue]
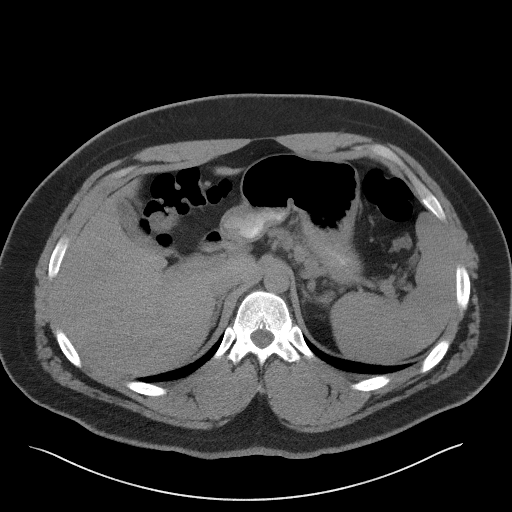
[im 88/101  soft-tissue]
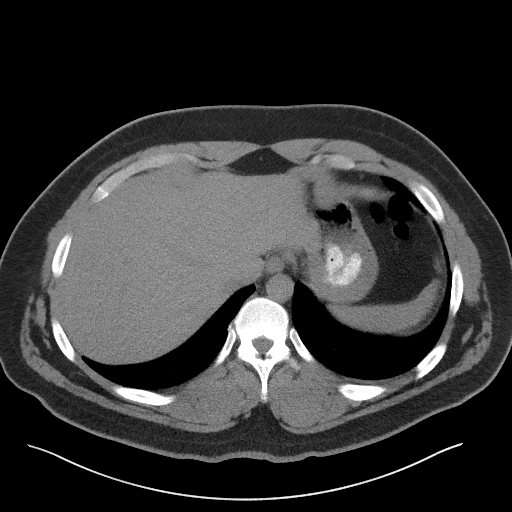
[im 96/101  soft-tissue]
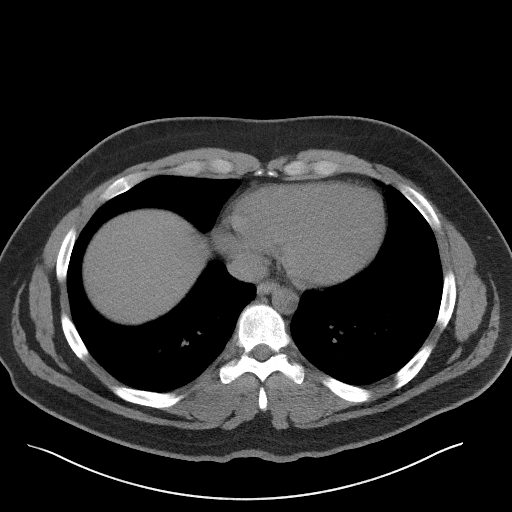

[Series 5: coronal · coronal · 0.87mm/px · 3 of 158 slices shown]
[im 53/158  soft-tissue]
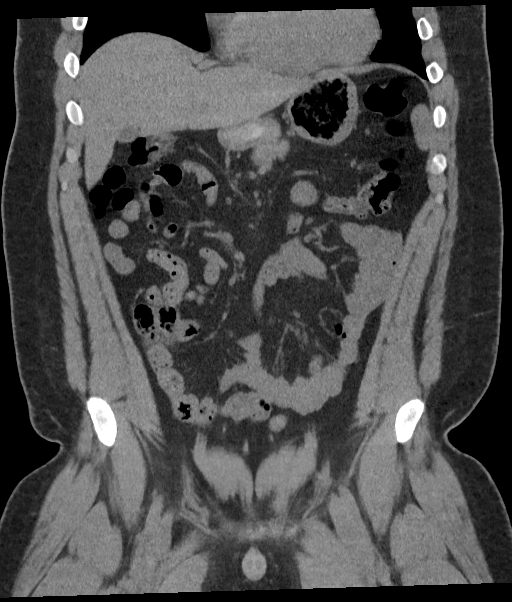
[im 70/158  soft-tissue]
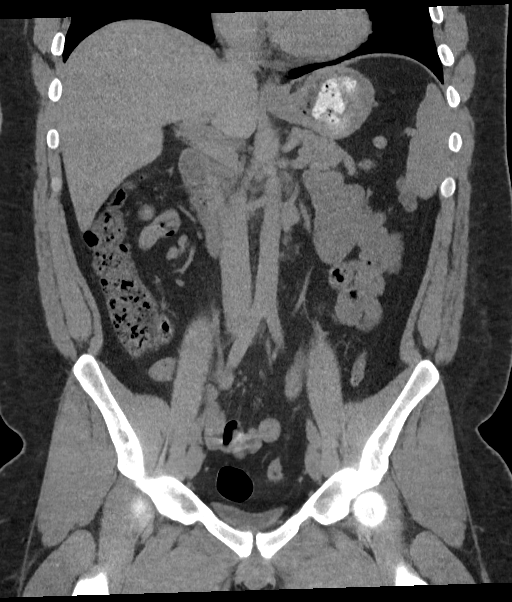
[im 88/158  soft-tissue]
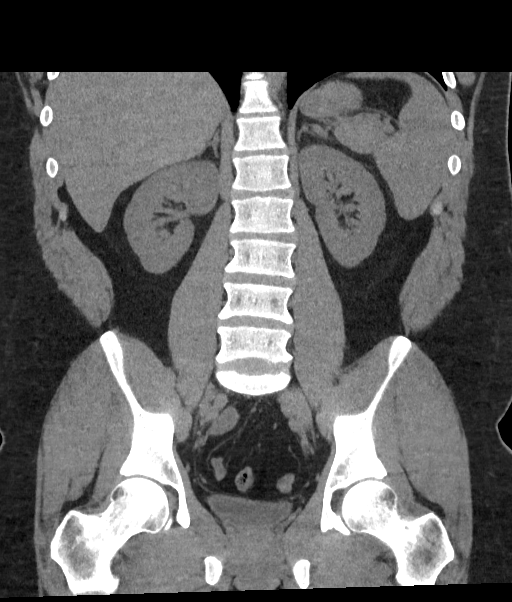

[16 of 46 positions shown; findings below may reference images not displayed]

FINDINGS: Lower chest: No acute abnormality.

Hepatobiliary: No focal liver abnormality is seen. No gallstones,
gallbladder wall thickening, or biliary dilatation.

Pancreas: Unremarkable. No pancreatic ductal dilatation or
surrounding inflammatory changes.

Spleen: The spleen is enlarged measuring approximately 14 cm.

Adrenals/Urinary Tract: Adrenal glands are unremarkable. Kidneys are
normal, without renal calculi, focal lesion, or hydronephrosis.
Bladder is unremarkable.

Stomach/Bowel: Stomach is within normal limits. Appendix appears
normal. No evidence of bowel wall thickening, distention, or
inflammatory changes.

Vascular/Lymphatic: No significant vascular findings are present. No
enlarged abdominal or pelvic lymph nodes.

Reproductive: Prostate is unremarkable.

Other: No abdominal wall hernia or abnormality. No abdominopelvic
ascites.

Musculoskeletal: No fracture is seen.
IMPRESSION: 1. No acute abnormality. No hydronephrosis. No radiopaque
obstructing kidney stones. Normal appendix in the right lower
quadrant.
2. Nonspecific splenomegaly.

## 2022-03-02 ENCOUNTER — Ambulatory Visit: Payer: PRIVATE HEALTH INSURANCE | Admitting: Nurse Practitioner

## 2022-03-03 ENCOUNTER — Encounter: Payer: Self-pay | Admitting: Nurse Practitioner

## 2022-03-03 ENCOUNTER — Ambulatory Visit: Payer: BC Managed Care – PPO | Admitting: Nurse Practitioner

## 2022-03-03 VITALS — BP 104/74 | HR 69 | Temp 97.5°F | Resp 14 | Ht 72.75 in | Wt 336.2 lb

## 2022-03-03 DIAGNOSIS — Z23 Encounter for immunization: Secondary | ICD-10-CM | POA: Diagnosis not present

## 2022-03-03 DIAGNOSIS — J3089 Other allergic rhinitis: Secondary | ICD-10-CM | POA: Diagnosis not present

## 2022-03-03 DIAGNOSIS — Z0000 Encounter for general adult medical examination without abnormal findings: Secondary | ICD-10-CM | POA: Diagnosis not present

## 2022-03-03 DIAGNOSIS — J309 Allergic rhinitis, unspecified: Secondary | ICD-10-CM | POA: Insufficient documentation

## 2022-03-03 DIAGNOSIS — K219 Gastro-esophageal reflux disease without esophagitis: Secondary | ICD-10-CM | POA: Diagnosis not present

## 2022-03-03 LAB — CBC
HCT: 44.2 % (ref 38.5–50.0)
Platelets: 244 10*3/uL (ref 140–400)
RBC: 5.21 10*6/uL (ref 4.20–5.80)
RDW: 12.7 % (ref 11.0–15.0)

## 2022-03-03 NOTE — Assessment & Plan Note (Signed)
Discussed age-appropriate immunizations and screening exams.  Did encourage healthy lifestyle modifications.  Patient was given preventative health maintenance information along with anticipatory guidance at dismissal from clinic.

## 2022-03-03 NOTE — Progress Notes (Signed)
New Patient Office Visit  Subjective    Patient ID: George Clay, male    DOB: [birth year]  Age: 21 y.o. MRN: [id number]  CC:  Chief Complaint  Patient presents with   Establish Care    [facility]    Annual Exam    HPI George Clay presents to establish care  Allergies: zyrtec. Will alternate antihistime and singular  GERD/heartburn: States that it is intermittent. States 1-2 times a week. Trigger food and alcoghol   for complete physical and follow up of chronic conditions.  Immunizations: -Tetanus:2021 -Influenza: update today -Shingles: too young -Pneumonia:  too young  -HPV: thinks up to date  Diet: [place]. States that he eats 3 meals a day. During the school skips lunch. Does cook at home. Cookies as snack. 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  Eye exam: Completes annually. this year  in Ct glasses and contacts Dental exam: Completes semi-annually    Colonoscopy: Too young, currently average risk Lung Cancer Screening: N/A Dexa: N/A  PSA: Too young, currently average risk  Sleep: 11pm bedtime 630am-7 am and feels rested. Does snore.   Outpatient Encounter Medications as of 03/03/2022  Medication Sig   cetirizine (ZYRTEC) 10 MG tablet Take by mouth.   famotidine (PEPCID) 20 MG tablet Take by mouth. As needed   montelukast (SINGULAIR) 10 MG tablet Take by mouth.   [DISCONTINUED] tetracycline (SUMYCIN) 250 MG capsule Take 250 mg by mouth 4 (four) times daily.   No facility-administered encounter medications on file as of 03/03/2022.    Past Medical History:  Diagnosis Date   Allergic rhinitis    GERD (gastroesophageal reflux disease)     Past Surgical History:  Procedure Laterality Date   TONSILLECTOMY      Family History  Problem Relation Age of Onset   Hypertension Mother    Hypertension Father    Thyroid cancer Father 77   Skin cancer Paternal Grandmother     Social History   Socioeconomic History    Marital status: Single    Spouse name: Not on file   Number of children: Not on file   Years of education: Not on file   Highest education level: Bachelor's degree (e.g., BA, AB, BS)  Occupational History   Not on file  Tobacco Use   Smoking status: Never    Passive exposure: Never   Smokeless tobacco: Never  Vaping Use   Vaping Use: Never used  Substance and Sexual Activity   Alcohol use: Yes    Alcohol/week: 6.0 standard drinks of alcohol    Types: 6 Cans of beer per week    Comment: friday night   Drug use: Not Currently    Types: Marijuana    Comment: marijuana in the past   Sexual activity: Yes  Other Topics Concern   Not on file  Social History Narrative   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  Respiratory:  Negative for shortness of breath.   Cardiovascular:  Negative for chest pain and leg swelling.  Gastrointestinal:  Negative for abdominal pain, blood in stool, constipation, diarrhea, nausea and vomiting.       BM daily  Genitourinary:  Negative for dysuria and hematuria.  Neurological:  Negative for tingling and headaches.  Psychiatric/Behavioral:  Negative for hallucinations and suicidal ideas.         Objective    BP 104/74   Pulse 69   Temp (!) 97.5 F (36.4 C)   Resp 14   Ht 6' 0.75" (1.848 m)   Wt (!) 336 lb 4 oz (152.5 kg)   SpO2 97%   BMI 44.67 kg/m   Physical Exam Vitals and nursing note reviewed. Exam conducted with a chaperone present [facility], RMA).  Constitutional:      Appearance: Normal appearance. He is obese.  HENT:     Right Ear: Tympanic membrane, ear canal  and external ear normal.     Left Ear: Tympanic membrane, ear canal and external ear normal.     Mouth/Throat:     Mouth: Mucous membranes are moist.     Pharynx: Oropharynx is clear.  Eyes:     Extraocular Movements: Extraocular movements intact.     Pupils: Pupils are equal, round, and reactive to light.     Comments: Wears glasses  Cardiovascular:     Rate and Rhythm: Normal rate and regular rhythm.     Heart sounds: Normal heart sounds.  Pulmonary:     Effort: Pulmonary effort is normal.     Breath sounds: Normal breath sounds.  Abdominal:     General: Bowel sounds are normal. There is no distension.     Palpations: There is no mass.     Tenderness: There is no abdominal tenderness.     Hernia: No hernia is present. There is no hernia in the left inguinal area or right inguinal area.  Genitourinary:    Penis: Normal.      Testes: Normal.     Epididymis:     Right: Normal.     Left: Normal.  Musculoskeletal:     Right lower leg: No edema.     Left lower leg: No edema.  Lymphadenopathy:     Cervical: No cervical adenopathy.  Skin:    General: Skin is warm.  Neurological:     General: No focal deficit present.     Mental Status: He is alert.     Deep Tendon Reflexes:     Reflex Scores:      Bicep reflexes are 2+ on the right side and 2+ on the left side.      Patellar reflexes are 2+ on the right side and 2+ on the left side.    Comments: Bilateral upper and lower extremity strength 5/5.  Psychiatric:        Mood and Affect: Mood normal.        Behavior: Behavior normal.        Thought Content: Thought content normal.        Judgment: Judgment normal.         Assessment & Plan:   Problem List Items Addressed This Visit       Respiratory   Allergic rhinitis    Patient uses a combination of Singulair, Zyrtec, and/or Allegra.  Continue taking medication as prescribed for 1 patient to not take both second-generation histamines at the same time.  He  acknowledged        Digestive   Gastroesophageal reflux disease    Takes famotidine intermittently.  Approximate twice  a week.  He does know his triggers.      Relevant Medications   famotidine (PEPCID) 20 MG tablet     Other   Morbid obesity (HCC)    Discussed healthy lifestyle modifications inclusive of exercise 30 minutes 5 times a week.  Also discussed the importance of limiting alcohol consumption in 1 setting.      Relevant Orders   Hemoglobin A1c   TSH   Lipid panel   Preventative health care - Primary    Discussed age-appropriate immunizations and screening exams.  Did encourage healthy lifestyle modifications.  Patient was given preventative health maintenance information along with anticipatory guidance at dismissal from clinic.      Relevant Orders   CBC   Comprehensive metabolic panel   Hemoglobin A1c   TSH   Lipid panel   Other Visit Diagnoses     Need for influenza vaccination           Return in about 1 year (around 03/04/2023) for CPE and labs.   Audria Nine, NP

## 2022-03-03 NOTE — Assessment & Plan Note (Signed)
Takes famotidine intermittently.  Approximate twice a week.  He does know his triggers.

## 2022-03-03 NOTE — Assessment & Plan Note (Signed)
Discussed healthy lifestyle modifications inclusive of exercise 30 minutes 5 times a week.  Also discussed the importance of limiting alcohol consumption in 1 setting.

## 2022-03-03 NOTE — Patient Instructions (Signed)
Nice to see you today We recommend exercising 30 mins a day a t 5 times a week Also recommend no more than 2 alcoholic beverages at a time when drinking Follow up with me in 1 year for your next physical, sooner if you need me  I will be in touch with labs once I have them

## 2022-03-03 NOTE — Assessment & Plan Note (Signed)
Patient uses a combination of Singulair, Zyrtec, and/or Allegra.  Continue taking medication as prescribed for 1 patient to not take both second-generation histamines at the same time.  He acknowledged

## 2022-03-04 LAB — CBC
Hemoglobin: 16 g/dL (ref 13.2–17.1)
MCH: 30.7 pg (ref 27.0–33.0)
MCHC: 36.2 g/dL — ABNORMAL HIGH (ref 32.0–36.0)
MCV: 84.8 fL (ref 80.0–100.0)
MPV: 9.8 fL (ref 7.5–12.5)
WBC: 6.1 10*3/uL (ref 3.8–10.8)

## 2022-03-04 LAB — HEMOGLOBIN A1C
Hgb A1c MFr Bld: 5.2 % of total Hgb (ref <5.7)
Mean Plasma Glucose: 103 mg/dL
eAG (mmol/L): 5.7 mmol/L

## 2022-03-04 LAB — COMPREHENSIVE METABOLIC PANEL
AG Ratio: 1.4 (calc) (ref 1.0–2.5)
ALT: 39 U/L (ref 9–46)
AST: 20 U/L (ref 10–40)
Albumin: 4.4 g/dL (ref 3.6–5.1)
Alkaline phosphatase (APISO): 62 U/L (ref 36–130)
BUN: 12 mg/dL (ref 7–25)
CO2: 24 mmol/L (ref 20–32)
Calcium: 9.3 mg/dL (ref 8.6–10.3)
Chloride: 104 mmol/L (ref 98–110)
Creat: 1.08 mg/dL (ref 0.60–1.24)
Globulin: 3.2 g/dL (calc) (ref 1.9–3.7)
Glucose, Bld: 88 mg/dL (ref 65–99)
Potassium: 4.1 mmol/L (ref 3.5–5.3)
Sodium: 138 mmol/L (ref 135–146)
Total Bilirubin: 0.6 mg/dL (ref 0.2–1.2)
Total Protein: 7.6 g/dL (ref 6.1–8.1)

## 2022-03-04 LAB — TSH: TSH: 1.2 mIU/L (ref 0.40–4.50)

## 2022-03-04 LAB — LIPID PANEL
Cholesterol: 233 mg/dL — ABNORMAL HIGH (ref <200)
HDL: 33 mg/dL — ABNORMAL LOW (ref >=40)
LDL Cholesterol (Calc): 178 mg/dL (calc) — ABNORMAL HIGH
Non-HDL Cholesterol (Calc): 200 mg/dL (calc) — ABNORMAL HIGH (ref <130)
Total CHOL/HDL Ratio: 7.1 (calc) — ABNORMAL HIGH (ref <5.0)
Triglycerides: 97 mg/dL (ref <150)

## 2023-03-06 ENCOUNTER — Encounter: Payer: Self-pay | Admitting: Nurse Practitioner

## 2023-03-06 ENCOUNTER — Ambulatory Visit (INDEPENDENT_AMBULATORY_CARE_PROVIDER_SITE_OTHER): Payer: BC Managed Care – PPO | Admitting: Nurse Practitioner

## 2023-03-06 VITALS — BP 120/80 | HR 58 | Temp 98.4°F | Ht 73.0 in | Wt 330.2 lb

## 2023-03-06 DIAGNOSIS — Z6841 Body Mass Index (BMI) 40.0 and over, adult: Secondary | ICD-10-CM

## 2023-03-06 DIAGNOSIS — Z114 Encounter for screening for human immunodeficiency virus [HIV]: Secondary | ICD-10-CM | POA: Diagnosis not present

## 2023-03-06 DIAGNOSIS — Z1159 Encounter for screening for other viral diseases: Secondary | ICD-10-CM

## 2023-03-06 DIAGNOSIS — E785 Hyperlipidemia, unspecified: Secondary | ICD-10-CM | POA: Diagnosis not present

## 2023-03-06 DIAGNOSIS — H6123 Impacted cerumen, bilateral: Secondary | ICD-10-CM | POA: Insufficient documentation

## 2023-03-06 DIAGNOSIS — Z23 Encounter for immunization: Secondary | ICD-10-CM | POA: Diagnosis not present

## 2023-03-06 DIAGNOSIS — J301 Allergic rhinitis due to pollen: Secondary | ICD-10-CM

## 2023-03-06 DIAGNOSIS — Z0001 Encounter for general adult medical examination with abnormal findings: Secondary | ICD-10-CM | POA: Diagnosis not present

## 2023-03-06 DIAGNOSIS — K219 Gastro-esophageal reflux disease without esophagitis: Secondary | ICD-10-CM

## 2023-03-06 DIAGNOSIS — Z0000 Encounter for general adult medical examination without abnormal findings: Secondary | ICD-10-CM

## 2023-03-06 LAB — COMPREHENSIVE METABOLIC PANEL
ALT: 30 U/L (ref 0–53)
AST: 17 U/L (ref 0–37)
Albumin: 4.6 g/dL (ref 3.5–5.2)
Alkaline Phosphatase: 53 U/L (ref 39–117)
BUN: 18 mg/dL (ref 6–23)
CO2: 25 meq/L (ref 19–32)
Calcium: 9.6 mg/dL (ref 8.4–10.5)
Chloride: 105 meq/L (ref 96–112)
Creatinine, Ser: 0.97 mg/dL (ref 0.40–1.50)
GFR: 109.09 mL/min (ref >60.00)
Glucose, Bld: 94 mg/dL (ref 70–99)
Potassium: 4.3 meq/L (ref 3.5–5.1)
Sodium: 138 meq/L (ref 135–145)
Total Bilirubin: 0.6 mg/dL (ref 0.2–1.2)
Total Protein: 7.4 g/dL (ref 6.0–8.3)

## 2023-03-06 LAB — LIPID PANEL
Cholesterol: 210 mg/dL — ABNORMAL HIGH (ref 0–200)
HDL: 36.3 mg/dL — ABNORMAL LOW (ref >39.00)
LDL Cholesterol: 142 mg/dL — ABNORMAL HIGH (ref 0–99)
NonHDL: 173.37
Total CHOL/HDL Ratio: 6
Triglycerides: 157 mg/dL — ABNORMAL HIGH (ref 0.0–149.0)
VLDL: 31.4 mg/dL (ref 0.0–40.0)

## 2023-03-06 LAB — CBC
HCT: 46.5 % (ref 39.0–52.0)
Hemoglobin: 16.1 g/dL (ref 13.0–17.0)
MCHC: 34.7 g/dL (ref 30.0–36.0)
MCV: 87.9 fL (ref 78.0–100.0)
Platelets: 237 10*3/uL (ref 150.0–400.0)
RBC: 5.29 Mil/uL (ref 4.22–5.81)
RDW: 13.2 % (ref 11.5–15.5)
WBC: 7 10*3/uL (ref 4.0–10.5)

## 2023-03-06 LAB — HEMOGLOBIN A1C: Hgb A1c MFr Bld: 4.8 % (ref 4.6–6.5)

## 2023-03-06 LAB — TSH: TSH: 1.45 u[IU]/mL (ref 0.35–5.50)

## 2023-03-06 NOTE — Assessment & Plan Note (Signed)
Patient has made some dietary changes since graduating college.  States it is improved greatly.  No longer requiring Pepcid on a regular basis

## 2023-03-06 NOTE — Assessment & Plan Note (Signed)
Verbal consent obtained.  Patient was prepped per office policy.  A mixture of water hydroperoxide was used.  Both the ears were irrigated.  Patient tolerated procedure well.  Both impactions were dislodged.  TMs within normal limits postprocedure

## 2023-03-06 NOTE — Assessment & Plan Note (Signed)
Discussed age-appropriate immunizations and screening exams.  Did review patient's personal, surgical, social, family histories.  Patient is up-to-date on all age-appropriate vaccinations he would like.  Did update patient's flu vaccine today.  Patient is too young for CRC screening, prostate cancer screening.  Patient declines STI screening today.  Patient was given information at discharge about preventative healthcare maintenance with anticipatory guidance

## 2023-03-06 NOTE — Assessment & Plan Note (Signed)
Patient currently maintained on Zyrtec as needed.  No longer taking Singulair.  Continue Zyrtec as needed

## 2023-03-06 NOTE — Assessment & Plan Note (Signed)
History of the same pending lipid panel.  Continue working on lifestyle modifications

## 2023-03-06 NOTE — Patient Instructions (Signed)
Nice to see you today I will be in touch with the labs once I have them Follow up with me in 1 year, sooner if you need me  Work on trying to incorporate some exercise. The recommendation sis 30 mins a day 5 times a week  We did update your flu vaccine today

## 2023-03-06 NOTE — Progress Notes (Signed)
Established Patient Office Visit  Subjective   Patient ID: George Clay, male    DOB: February 24, 2001  Age: 22 y.o. MRN: [id number]  Chief Complaint  Patient presents with   Annual Exam    Wants flu shot     HPI   Allergies: zyrtec as needed and and not taking the singular any longer  GERD: states that he does not have to use the heartburn medications. States that his diet has cleaned up some since graduating college.  for complete physical and follow up of chronic conditions.  Immunizations: -Tetanus: Completed in 2021 -Influenza: update today  -Shingles: too young  -Pneumonia: too young  -covid: original and booster  Diet: Fair diet. States that he is eating 1 meal a day (large meal) no snacks. States that he is drinking water  Exercise: No regular exercise. 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   Eye exam: Completes annually. Glasses   Dental exam: Completes semi-annually    Colonoscopy: Too young, currently risk Lung Cancer Screening: N/A  PSA: Too young, currently average risk  Sleep; states that he is goig to bed around 10-11 and on the weekend it will be later. He will get up around 7am. Feels rested. Does not snore       Review of Systems  Constitutional:  Negative for chills and fever.  Respiratory:  Negative for shortness of breath.   Cardiovascular:  Negative for chest pain and leg swelling.  Gastrointestinal:  Negative for abdominal pain, blood in stool, constipation, diarrhea, nausea and vomiting.       BM daily   Genitourinary:  Negative for dysuria and hematuria.  Neurological:  Negative for tingling and headaches.  Psychiatric/Behavioral:  Negative for hallucinations and suicidal ideas.       Objective:     BP 120/80   Pulse (!) 58   Temp 98.4 F (36.9 C) (Oral)   Ht 6\' 1"  (1.854 m)   Wt (!) 330 lb 3.2 oz (149.8 kg)   SpO2 98%   BMI 43.56 kg/m  BP Readings from Last 3 Encounters:  03/06/23 120/80  03/03/22 104/74  11/17/18 127/67   Wt  Readings from Last 3 Encounters:  03/06/23 (!) 330 lb 3.2 oz (149.8 kg)  03/03/22 (!) 336 lb 4 oz (152.5 kg)  11/16/18 (!) 310 lb 13.6 oz (141 kg)   SpO2 Readings from Last 3 Encounters:  03/06/23 98%  03/03/22 97%  11/17/18 97%      Physical Exam Vitals and nursing note reviewed. Exam conducted with a chaperone present Melina Copa, CMA).  Constitutional:      Appearance: Normal appearance.  HENT:     Right Ear: Tympanic membrane, ear canal and external ear normal.     Left Ear: Tympanic membrane, ear canal and external ear normal.     Mouth/Throat:     Mouth: Mucous membranes are moist.     Pharynx: Oropharynx is clear.  Eyes:     Extraocular Movements: Extraocular movements intact.     Pupils: Pupils are equal, round, and reactive to light.  Cardiovascular:     Rate and Rhythm: Normal rate and regular rhythm.     Pulses: Normal pulses.     Heart sounds: Normal heart sounds.  Pulmonary:     Effort: Pulmonary effort is normal.     Breath sounds: Normal breath sounds.  Abdominal:     General: Bowel sounds are normal. There is no distension.     Palpations: There is no mass.  Tenderness: There is no abdominal tenderness.     Hernia: No hernia is present. There is no hernia in the left inguinal area or right inguinal area.  Genitourinary:    Penis: Normal.   Musculoskeletal:     Right lower leg: No edema.     Left lower leg: No edema.  Lymphadenopathy:     Cervical: No cervical adenopathy.     Lower Body: No right inguinal adenopathy. No left inguinal adenopathy.  Skin:    General: Skin is warm.  Neurological:     General: No focal deficit present.     Mental Status: He is alert.     Deep Tendon Reflexes:     Reflex Scores:      Bicep reflexes are 2+ on the right side and 2+ on the left side.      Patellar reflexes are 2+ on the right side and 2+ on the left side.    Comments: Bilateral upper and lower extremity strength 5/5  Psychiatric:        Mood and  Affect: Mood normal.        Behavior: Behavior normal.        Thought Content: Thought content normal.        Judgment: Judgment normal.      No results found for any visits on 03/06/23.    The ASCVD Risk score (Arnett DK, et al., 2019) failed to calculate for the following reasons:   The 2019 ASCVD risk score is only valid for ages 34 to 69    Assessment & Plan:   Problem List Items Addressed This Visit       Respiratory   Allergic rhinitis    Patient currently maintained on Zyrtec as needed.  No longer taking Singulair.  Continue Zyrtec as needed        Digestive   Gastroesophageal reflux disease    Patient has made some dietary changes since graduating college.  States it is improved greatly.  No longer requiring Pepcid on a regular basis        Nervous and Auditory   Bilateral impacted cerumen    Verbal consent obtained.  Patient was prepped per office policy.  A mixture of water hydroperoxide was used.  Both the ears were irrigated.  Patient tolerated procedure well.  Both impactions were dislodged.  TMs within normal limits postprocedure      Relevant Orders   Ear Lavage     Other   Morbid obesity (HCC)    Pending TSH, A1c, lipid panel.  Encourage patient to work on getting exercise of 30 minutes a day 5 times a week.      Relevant Orders   Hemoglobin A1c   Preventative health care - Primary    Discussed age-appropriate immunizations and screening exams.  Did review patient's personal, surgical, social, family histories.  Patient is up-to-date on all age-appropriate vaccinations he would like.  Did update patient's flu vaccine today.  Patient is too young for CRC screening, prostate cancer screening.  Patient declines STI screening today.  Patient was given information at discharge about preventative healthcare maintenance with anticipatory guidance      Relevant Orders   CBC   Comprehensive metabolic panel   TSH   Dyslipidemia    History of the same  pending lipid panel.  Continue working on lifestyle modifications      Relevant Orders   Hemoglobin A1c   Lipid panel   Other Visit Diagnoses  Encounter for hepatitis C screening test for low risk patient       Relevant Orders   Hepatitis C Antibody   Encounter for screening for HIV       Relevant Orders   HIV antibody (with reflex)   Need for influenza vaccination       Relevant Orders   Flu vaccine trivalent PF, 6mos and older(Flulaval,Afluria,Fluarix,Fluzone) (Completed)       Return in about 1 year (around 03/05/2024) for CPE and Labs.    Audria Nine, NP

## 2023-03-06 NOTE — Assessment & Plan Note (Signed)
Pending TSH, A1c, lipid panel.  Encourage patient to work on getting exercise of 30 minutes a day 5 times a week.

## 2023-03-07 LAB — HEPATITIS C ANTIBODY: Hepatitis C Ab: NONREACTIVE

## 2023-03-07 LAB — HIV ANTIBODY (ROUTINE TESTING W REFLEX): HIV 1&2 Ab, 4th Generation: NONREACTIVE

## 2023-08-07 ENCOUNTER — Ambulatory Visit
Admission: RE | Admit: 2023-08-07 | Discharge: 2023-08-07 | Disposition: A | Discharge status: Home / Self Care | Source: Ambulatory Visit

## 2023-08-07 VITALS — BP 127/71 | HR 84 | Temp 98.0°F | Resp 18

## 2023-08-07 DIAGNOSIS — J069 Acute upper respiratory infection, unspecified: Secondary | ICD-10-CM

## 2023-08-07 MED ORDER — AZITHROMYCIN 250 MG PO TABS: 250.0000 mg | ORAL_TABLET | Freq: Every day | ORAL | 0 refills | Status: AC

## 2023-08-07 NOTE — ED Triage Notes (Signed)
 Patient to Urgent Care with complaints of productive cough (green/ thick)/ nasal congestion.  Taking Dayquil. Denies any known fevers.

## 2023-08-07 NOTE — ED Provider Notes (Signed)
 George Clay    CSN: [id number] Arrival date & time: 08/07/23  1712      History   Chief Complaint Chief Complaint  Patient presents with   Cough    Bad Cough/Congestion. - Entered by patient    HPI George Clay is a 23 y.o. male.  Patient presents with 5-day history of congestion and cough.  No fever or shortness of breath.  Treating symptoms with DayQuil.  The history is provided by the patient and medical records.    Past Medical History:  Diagnosis Date   Allergic rhinitis    Dyslipidemia 03/06/2023   GERD (gastroesophageal reflux disease)     Patient Active Problem List   Diagnosis Date Noted   Dyslipidemia 03/06/2023   Bilateral impacted cerumen 03/06/2023   Allergic rhinitis 03/03/2022   Morbid obesity (HCC) 03/03/2022   Gastroesophageal reflux disease 03/03/2022   Preventative health care 03/03/2022    Past Surgical History:  Procedure Laterality Date   TONSILLECTOMY         Home Medications    Prior to Admission medications   Medication Sig Start Date End Date Taking? Authorizing Provider  azithromycin (ZITHROMAX) 250 MG tablet Take 1 tablet (250 mg total) by mouth daily. Take first 2 tablets together, then 1 every day until finished. 08/07/23  Yes Mickie Bail, NP  fexofenadine-pseudoephedrine (ALLEGRA-D) 60-120 MG 12 hr tablet Take 1 tablet by mouth 2 (two) times daily.   Yes [provider]  cetirizine (ZYRTEC) 10 MG tablet Take by mouth.    [provider]  famotidine (PEPCID) 20 MG tablet Take by mouth. As needed 08/11/20   [provider]  montelukast (SINGULAIR) 10 MG tablet Take by mouth.    [provider]    Family History Family History  Problem Relation Age of Onset   Hypertension Mother        lump found in breasts but no breast cancer   Hypertension Father    Thyroid cancer Father 63   Skin cancer Paternal Grandmother     Social History Social History   Tobacco Use   Smoking  status: Never    Passive exposure: Never   Smokeless tobacco: Never   Tobacco comments:    Smokes a cigarette once a week.   Vaping Use   Vaping status: Every Day   Substances: Nicotine, Flavoring  Substance Use Topics   Alcohol use: Not Currently    Alcohol/week: 6.0 standard drinks of alcohol    Types: 6 Cans of beer per week    Comment: friday night   Drug use: Not Currently    Types: Marijuana    Comment: marijuana in the past     Allergies   Penicillins and Amoxicillin   Review of Systems Review of Systems  Constitutional:  Negative for chills and fever.  HENT:  Positive for congestion. Negative for ear pain and sore throat.   Respiratory:  Positive for cough. Negative for shortness of breath.      Physical Exam Triage Vital Signs ED Triage Vitals  Encounter Vitals Group     BP      Systolic BP Percentile      Diastolic BP Percentile      Pulse      Resp      Temp      Temp src      SpO2      Weight      Height      Head Circumference  Peak Flow      Pain Score      Pain Loc      Pain Education      Exclude from Growth Chart    No data found.  Updated Vital Signs BP 127/71   Pulse 84   Temp 98 F (36.7 C)   Resp 18   SpO2 98%   Visual Acuity Right Eye Distance:   Left Eye Distance:   Bilateral Distance:    Right Eye Near:   Left Eye Near:    Bilateral Near:     Physical Exam Constitutional:      General: He is not in acute distress. HENT:     Right Ear: Tympanic membrane normal.     Left Ear: Tympanic membrane normal.     Nose: Rhinorrhea present.     Mouth/Throat:     Mouth: Mucous membranes are moist.     Pharynx: Oropharynx is clear.  Cardiovascular:     Rate and Rhythm: Normal rate and regular rhythm.     Heart sounds: Normal heart sounds.  Pulmonary:     Effort: Pulmonary effort is normal. No respiratory distress.     Breath sounds: Normal breath sounds.  Neurological:     Mental Status: He is alert.      UC  Treatments / Results  Labs (all labs ordered are listed, but only abnormal results are displayed) Labs Reviewed - No data to display  EKG   Radiology No results found.  Procedures Procedures (including critical care time)  Medications Ordered in UC Medications - No data to display  Initial Impression / Assessment and Plan / UC Course  I have reviewed the triage vital signs and the nursing notes.  Pertinent labs & imaging results that were available during my care of the patient were reviewed by me and considered in my medical decision making (see chart for details).    Acute upper respiratory infection.  Lungs are clear and O2 sat is 98% on room air.  Patient has been symptomatic for 5 days and is not improving with OTC treatment.  Treating today with Zithromax.  Tylenol or ibuprofen as needed.  Plain Mucinex as needed.  Instructed patient to follow-up with his PCP if he is not improving.  He agrees to plan of care.  Final Clinical Impressions(s) / UC Diagnoses   Final diagnoses:  Acute upper respiratory infection     Discharge Instructions      Take the Zithromax as directed.  Follow-up with your primary care provider if your symptoms are not improving.      ED Prescriptions     Medication Sig Dispense Auth. Provider   azithromycin (ZITHROMAX) 250 MG tablet Take 1 tablet (250 mg total) by mouth daily. Take first 2 tablets together, then 1 every day until finished. 6 tablet Mickie Bail, NP      PDMP not reviewed this encounter.   Mickie Bail, NP 08/07/23 [phone]

## 2023-08-07 NOTE — Discharge Instructions (Signed)
 Take the Zithromax as directed.  Follow up with your primary care provider if your symptoms are not improving.
